# Patient Record
Sex: Male | Born: 1970 | Race: White | Hispanic: No | Smoking: Never smoker
Health system: Southern US, Community
[De-identification: ages and names within clinical notes are randomized; demographics above are authoritative.]

---

## 2019-11-04 ENCOUNTER — Emergency Department (HOSPITAL_BASED_OUTPATIENT_CLINIC_OR_DEPARTMENT_OTHER)
Admission: EM | Admit: 2019-11-04 | Discharge: 2019-11-04 | Disposition: A | Discharge status: Home / Self Care | Payer: No Typology Code available for payment source | Attending: Emergency Medicine | Admitting: Emergency Medicine

## 2019-11-04 ENCOUNTER — Encounter (HOSPITAL_BASED_OUTPATIENT_CLINIC_OR_DEPARTMENT_OTHER): Payer: Self-pay | Admitting: Emergency Medicine

## 2019-11-04 ENCOUNTER — Emergency Department (HOSPITAL_BASED_OUTPATIENT_CLINIC_OR_DEPARTMENT_OTHER): Payer: No Typology Code available for payment source

## 2019-11-04 ENCOUNTER — Other Ambulatory Visit: Payer: Self-pay

## 2019-11-04 DIAGNOSIS — W228XXA Striking against or struck by other objects, initial encounter: Secondary | ICD-10-CM | POA: Diagnosis not present

## 2019-11-04 DIAGNOSIS — Y9389 Activity, other specified: Secondary | ICD-10-CM | POA: Insufficient documentation

## 2019-11-04 DIAGNOSIS — S069X9A Unspecified intracranial injury with loss of consciousness of unspecified duration, initial encounter: Secondary | ICD-10-CM | POA: Insufficient documentation

## 2019-11-04 DIAGNOSIS — S0003XA Contusion of scalp, initial encounter: Secondary | ICD-10-CM | POA: Insufficient documentation

## 2019-11-04 DIAGNOSIS — Y990 Civilian activity done for income or pay: Secondary | ICD-10-CM | POA: Insufficient documentation

## 2019-11-04 DIAGNOSIS — Y92813 Airplane as the place of occurrence of the external cause: Secondary | ICD-10-CM | POA: Diagnosis not present

## 2019-11-04 DIAGNOSIS — S069X1A Unspecified intracranial injury with loss of consciousness of 30 minutes or less, initial encounter: Secondary | ICD-10-CM

## 2019-11-04 DIAGNOSIS — S0083XA Contusion of other part of head, initial encounter: Secondary | ICD-10-CM | POA: Diagnosis not present

## 2019-11-04 DIAGNOSIS — S0990XA Unspecified injury of head, initial encounter: Secondary | ICD-10-CM | POA: Diagnosis present

## 2019-11-04 DIAGNOSIS — J320 Chronic maxillary sinusitis: Secondary | ICD-10-CM | POA: Diagnosis not present

## 2019-11-04 NOTE — ED Triage Notes (Addendum)
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 Pt had a LOC for approximately 5 min per co-worker.  Pt was confused for several hours afterwards. EMS did come to scene and pt had stable vital signs. Pt c/o numbness to left cheek under eye. Denies any nausea.

## 2019-11-04 NOTE — ED Provider Notes (Signed)
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  He leaned down to avoid hitting his head on the door but the top of his head struck the edge of the door anyway.  He fell to the ground and states he was aware of people around him and asking him questions but states he was unable to respond or react for about 5 minutes.  It is unclear if he got knocked out, was just stunned, or had a syncopal episode.  Coworkers report that he was confused afterwards but that he returned to baseline.  He became concerned because he started having paresthesias and pain over his left cheekbone.  He rates pain in his head as a 6 out of 10 without significant modifying factors.  He has had some dizziness but no nausea or vomiting.   History reviewed. No pertinent past medical history.  History reviewed. No pertinent surgical history.  No family history on file.  Social History   Tobacco Use  . Smoking status: Never Smoker  . Smokeless tobacco: Never Used  Substance Use Topics  . Alcohol use: Never  . Drug use: Never    Prior to Admission medications   Not on File    Allergies Penicillins and Tetanus toxoids   REVIEW OF SYSTEMS  Negative except as noted here or in the History of Present Illness.   PHYSICAL EXAMINATION  Initial Vital Signs Blood pressure (!) 161/94, pulse 67, temperature 98.2 F (36.8 C), temperature source Oral, resp. rate 18, height 6\' 1"  (1.854 m), weight 75.3 kg, SpO2 99 %.  Examination General: Well-developed, well-nourished male in no acute distress; appearance consistent with age of record HENT: normocephalic; superficial hematoma to top of head and over left  cheek; no hemotympanum Eyes: pupils equal, round and reactive to light; extraocular muscles intact Neck: supple; nontender Heart: regular rate and rhythm Lungs: clear to auscultation bilaterally Abdomen: soft; nondistended; nontender; bowel sounds present Extremities: No deformity; full range of motion; pulses normal Neurologic: Awake, alert and oriented; motor function intact in all extremities and symmetric; no facial droop; normal coordination and speech Skin: Warm and dry Psychiatric: Normal mood and affect   RESULTS  Summary of this visit's results, reviewed and interpreted by myself:   EKG Interpretation  Date/Time:    Ventricular Rate:    PR Interval:    QRS Duration:   QT Interval:    QTC Calculation:   R Axis:     Text Interpretation:        Laboratory Studies: No results found for this or any previous visit (from the past 24 hour(s)). Imaging Studies: CT Head Wo Contrast  Result Date: 11/04/2019 CLINICAL DATA:  Head trauma. History of loss of consciousness. Headache and confusion. EXAM: CT HEAD WITHOUT CONTRAST TECHNIQUE: Contiguous axial images were obtained from the base of the skull through the vertex without intravenous contrast. COMPARISON:  None. FINDINGS: Brain: The ventricles are normal in size and configuration. No extra-axial fluid collections are identified. The gray-white differentiation is maintained. No CT findings for acute hemispheric infarction or intracranial hemorrhage. No mass lesions. The brainstem and cerebellum are normal. Vascular: No hyperdense vessels or obvious aneurysm.  Skull: No acute skull fracture. No bone lesion. Sinuses/Orbits: Near complete opacification of both maxillary sinuses likely due to large mucous retention cysts or polyps. Area of higher attenuation in the right maxillary sinus is likely inspissated material and could suggest chronic changes. Small amount of fluid in the sphenoid sinus. The frontal sinuses are clear. The mastoid  air cells and middle ear cavities are clear. Other: No scalp lesions or hematoma. IMPRESSION: 1. No acute intracranial findings or skull fracture. 2. Paranasal sinus disease. Electronically Signed   By: Rudie Meyer M.D.   On: 11/04/2019 06:40    ED COURSE and MDM  Nursing notes, initial and subsequent vitals signs, including pulse oximetry, reviewed and interpreted by myself.  Vitals:   11/04/19 0614 11/04/19 0615  BP: (!) 161/94   Pulse: 67   Resp: 18   Temp: 98.2 F (36.8 C)   TempSrc: Oral   SpO2: 99%   Weight:  75.3 kg  Height:  6\' 1"  (1.854 m)   Medications - No data to display  No evidence of significant intracranial injury on CT scan.  Patient advised of possible postconcussive symptoms and concerning symptoms that should warrant reevaluation.  PROCEDURES  Procedures   ED DIAGNOSES     ICD-10-CM   1. Head injury, closed, with brief LOC (HCC)  S06.9X9A   2. Chronic sinusitis of both maxillary sinuses  J32.0   3. Contusion of face, initial encounter  S00.83XA   4. Contusion of scalp, initial encounter  S00. , MD 11/04/19 [phone]

## 2019-11-11 ENCOUNTER — Emergency Department (HOSPITAL_BASED_OUTPATIENT_CLINIC_OR_DEPARTMENT_OTHER)
Admission: EM | Admit: 2019-11-11 | Discharge: 2019-11-11 | Disposition: A | Discharge status: Home / Self Care | Payer: No Typology Code available for payment source | Attending: Emergency Medicine | Admitting: Emergency Medicine

## 2019-11-11 ENCOUNTER — Encounter (HOSPITAL_BASED_OUTPATIENT_CLINIC_OR_DEPARTMENT_OTHER): Payer: Self-pay | Admitting: Emergency Medicine

## 2019-11-11 ENCOUNTER — Emergency Department (HOSPITAL_BASED_OUTPATIENT_CLINIC_OR_DEPARTMENT_OTHER): Payer: No Typology Code available for payment source

## 2019-11-11 DIAGNOSIS — S060X9A Concussion with loss of consciousness of unspecified duration, initial encounter: Secondary | ICD-10-CM | POA: Insufficient documentation

## 2019-11-11 DIAGNOSIS — M545 Low back pain, unspecified: Secondary | ICD-10-CM

## 2019-11-11 DIAGNOSIS — X58XXXA Exposure to other specified factors, initial encounter: Secondary | ICD-10-CM | POA: Diagnosis not present

## 2019-11-11 DIAGNOSIS — Y990 Civilian activity done for income or pay: Secondary | ICD-10-CM | POA: Diagnosis not present

## 2019-11-11 DIAGNOSIS — Y929 Unspecified place or not applicable: Secondary | ICD-10-CM | POA: Insufficient documentation

## 2019-11-11 DIAGNOSIS — S0990XA Unspecified injury of head, initial encounter: Secondary | ICD-10-CM | POA: Diagnosis present

## 2019-11-11 DIAGNOSIS — Y939 Activity, unspecified: Secondary | ICD-10-CM | POA: Insufficient documentation

## 2019-11-11 DIAGNOSIS — S060X9D Concussion with loss of consciousness of unspecified duration, subsequent encounter: Secondary | ICD-10-CM

## 2019-11-11 MED ORDER — METHOCARBAMOL 500 MG PO TABS
500.0000 mg | ORAL_TABLET | Freq: Two times a day (BID) | ORAL | 0 refills | Status: AC
Start: 2019-11-11 — End: ?

## 2019-11-11 NOTE — ED Provider Notes (Signed)
[facility] EMERGENCY DEPARTMENT Provider Note   CSN: [id number] Arrival date & time: 11/11/19  2051     History Chief Complaint  Patient presents with  . Back Pain  . Head Injury    Spencer Salazar is a 49 y.o. male who presents to the ED with complaint of gradual onset, constant, achy, low back pain s/p injury that occurred at work last week.  Per chart review patient was seen in the ED on 07/01 after he sustained a head injury while working at work.  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  He fell to the ground and had loss of consciousness for several minutes.  Head CT at that time was negative and patient was discharged home.  He states that since then he has continued to feel extremely fatigued.  He states that he has felt nauseated and had a lack of appetite.  He states that he has been staying at home and not going to work but despite this feels like he worked a 12-hour day.  He states he is having some twitching in his bilateral eyes as well.  Patient states that the back pain has been getting worse however he has not been taking any medication for it.  He denies any weakness, numbness, tingling, urinary retention, saddle anesthesia, urinary or bowel incontinence, any other associated symptoms.   The history is provided by the patient and medical records.       History reviewed. No pertinent past medical history.  There are no problems to display for this patient.   History reviewed. No pertinent surgical history.     No family history on file.  Social History   Tobacco Use  . Smoking status: Never Smoker  . Smokeless tobacco: Never Used  Substance Use Topics  . Alcohol use: Never  . Drug use: Never    Home Medications Prior to Admission medications   Medication Sig Start Date End Date Taking? Authorizing Provider  methocarbamol (ROBAXIN) 500 MG tablet Take 1 tablet (500 mg  total) by mouth 2 (two) times daily. 11/11/19   Tanda Rockers, PA-C    Allergies    Penicillins and Tetanus toxoids  Review of Systems   Review of Systems  Constitutional: Positive for appetite change and fatigue.  Gastrointestinal: Positive for nausea.  Musculoskeletal: Positive for back pain.  Neurological: Positive for headaches.  All other systems reviewed and are negative.   Physical Exam Updated Vital Signs BP (!) 138/101   Pulse 76   Temp 98.5 F (36.9 C) (Oral)   Resp 18   Ht 6\' 1"  (1.854 m)   Wt 75.3 kg   SpO2 99%   BMI 21.90 kg/m   Physical Exam Vitals and nursing note reviewed.  Constitutional:      Appearance: He is not ill-appearing or diaphoretic.  HENT:     Head: Normocephalic and atraumatic.  Eyes:     Extraocular Movements: Extraocular movements intact.     Conjunctiva/sclera: Conjunctivae normal.     Pupils: Pupils are equal, round, and reactive to light.  Cardiovascular:     Rate and Rhythm: Normal rate and regular rhythm.  Pulmonary:     Effort: Pulmonary effort is normal.     Breath sounds: Normal breath sounds.  Abdominal:     Palpations: Abdomen is soft.     Tenderness: There is no abdominal tenderness.  Musculoskeletal:  Cervical back: Neck supple.     Comments: No C or T midline spinal TTP. Mild lumbar midline spinal TTP however + paralumbar musculature TTP as well surrounding the area. ROM intact to neck and back. MAE without difficulty. Strength and sensation intact. 2+ distal pulses.   Skin:    General: Skin is warm and dry.  Neurological:     Mental Status: He is alert.     Comments: CN 3-12 grossly intact A&O x4 GCS 15 Sensation and strength intact Gait nonataxic including with tandem walking Coordination with finger-to-nose WNL Neg romberg, neg pronator drift     ED Results / Procedures / Treatments   Labs (all labs ordered are listed, but only abnormal results are displayed) Labs Reviewed - No data to  display  EKG None  Radiology DG Lumbar Spine Complete  Result Date: 11/11/2019 CLINICAL DATA:  Low back pain, recent head injury, initial encounter EXAM: LUMBAR SPINE - COMPLETE 4+ VIEW COMPARISON:  None. FINDINGS: Five lumbar type vertebral bodies are well visualized. Vertebral body height is well maintained. No pars defects are noted. No anterolisthesis is seen. Disc space narrowing is noted at L4-5 and to a greater degree at L5-S1. no soft tissue abnormality is seen. IMPRESSION: Mild degenerative change without acute abnormality. Electronically Signed   By: Alcide Clever M.D.   On: 11/11/2019 22:48    Procedures Procedures (including critical care time)  Medications Ordered in ED Medications - No data to display  ED Course  I have reviewed the triage vital signs and the nursing notes.  Pertinent labs & imaging results that were available during my care of the patient were reviewed by me and considered in my medical decision making (see chart for details).    MDM Rules/Calculators/A&P                           49 year old male who presents to the ED today with subsequent complaints of fatigue, nausea, lack of appetite, difficulty concentrating after head injury 1 week ago.  Was evaluated in the ED at that time with a negative CT head.  He states since then he has been having back pain in the lower aspect midline.  On exam patient does have some very minimal midline spinal tenderness however surrounding musculature tenderness as well.  I very much doubt fracture at this time however will obtain x-ray for assurance to patient.  He has no red flag symptoms today concerning for cauda equina, spinal epidural abscess, AAA.  He has no focal neuro deficits on exam today.  He continues to complain of a headache.  I suspect his symptoms are all related to a concussion.  He does appear extremely anxious regarding the fact that his work wants him to return and he does not feel up to it.  Will provide  information for concussion clinic at this time and write patient out for the next couple of days so he can continue to rest.  Do Not feel he needs repeat imaging at this time.  Xray negative at this time.  Discharged home with muscle relaxer.  Patient instructed to take ibuprofen and Tylenol as needed for pain.  Have given information for the concussion clinic.  Have written patient out of work for the next couple of days however he will need to be evaluated by concussion clinic if he needs to be out for longer.  Discharge at this time.  This note was prepared using  [employer] and may include unintentional dictation errors due to the inherent limitations of voice recognition software.  Final Clinical Impression(s) / ED Diagnoses Final diagnoses:  Concussion with loss of consciousness, subsequent encounter  Acute midline low back pain without sciatica    Rx / DC Orders ED Discharge Orders         Ordered    methocarbamol (ROBAXIN) 500 MG tablet  2 times daily     Discontinue  Reprint     11/11/19 2254           Discharge Instructions     Your xray did not show any signs of fracture in your back. Your pain is likely musculoskeletal in nature. Please take Ibuprofen and Tylenol as needed for pain. I have provided a muscle relaxer for you as well. Please take to any pharmacy and fill. DO NOT DRIVE WHILE ON THE MUSCLE RELAXER AS IT CAN MAKE YOU DROWSY.   I have provided a work note for the next couple of days however a specialist will need to decide if you have to be out for longer. Please contact the Concussion Clinic for further evaluation.   The  [facility] is the only comprehensive, holistic concussion clinic in the [place], [place] area. You can speak with one of our concussion-trained staff members during our regular office hours, Monday - Thursday from 7:30 AM to 4:30 PM, and Fridays from 7:30 AM to 12:00 PM, by calling our Concussion  Hotline: [phone].       Tanda Rockers, PA-C 11/11/19 2255    Virgina Norfolk, DO 11/11/19 2309

## 2019-11-11 NOTE — ED Triage Notes (Addendum)
Pt was seen 7/1 for head injury and now having lower back pain. Pt c/o fatigue, increased sleeping and decreased appetite. Pt states he has tried to return to work but having difficulty. Pt reports feeling anxious which is new for him

## 2019-11-11 NOTE — Discharge Instructions (Signed)
Your xray did not show any signs of fracture in your back. Your pain is likely musculoskeletal in nature. Please take Ibuprofen and Tylenol as needed for pain. I have provided a muscle relaxer for you as well. Please take to any pharmacy and fill. DO NOT DRIVE WHILE ON THE MUSCLE RELAXER AS IT CAN MAKE YOU DROWSY.   I have provided a work note for the next couple of days however a specialist will need to decide if you have to be out for longer. Please contact the Concussion Clinic for further evaluation.   The [facility] is the only comprehensive, holistic concussion clinic in the [place], [place] area. You can speak with one of our concussion-trained staff members during our regular office hours, Monday - Thursday from 7:30 AM to 4:30 PM, and Fridays from 7:30 AM to 12:00 PM, by calling our Concussion Hotline: [phone].

## 2021-06-03 IMAGING — CR DG LUMBAR SPINE COMPLETE 4+V
5 series · 5 of 5 positions shown · non-contrast
Comparison: None.

CLINICAL DATA: Low back pain, recent head injury, initial encounter

EXAM:
LUMBAR SPINE - COMPLETE 4+ VIEW

[t l-spine a.p.]
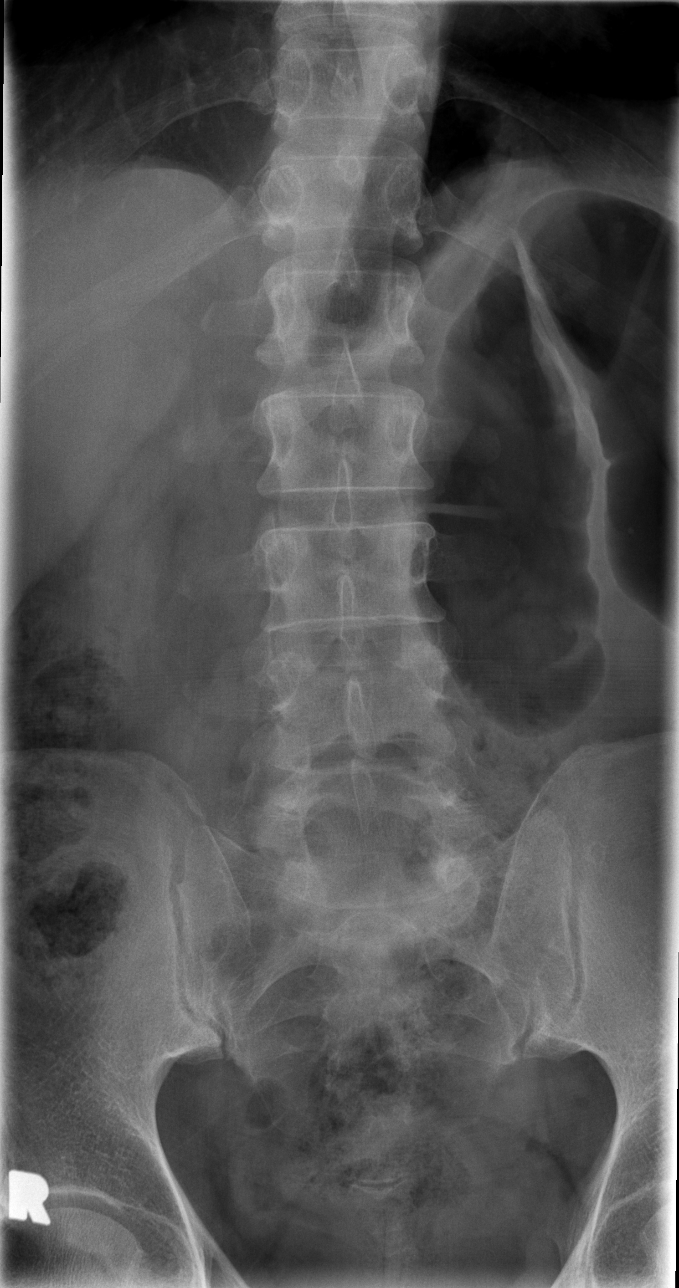

[t l-spine oblique exposure (1 of 2)]
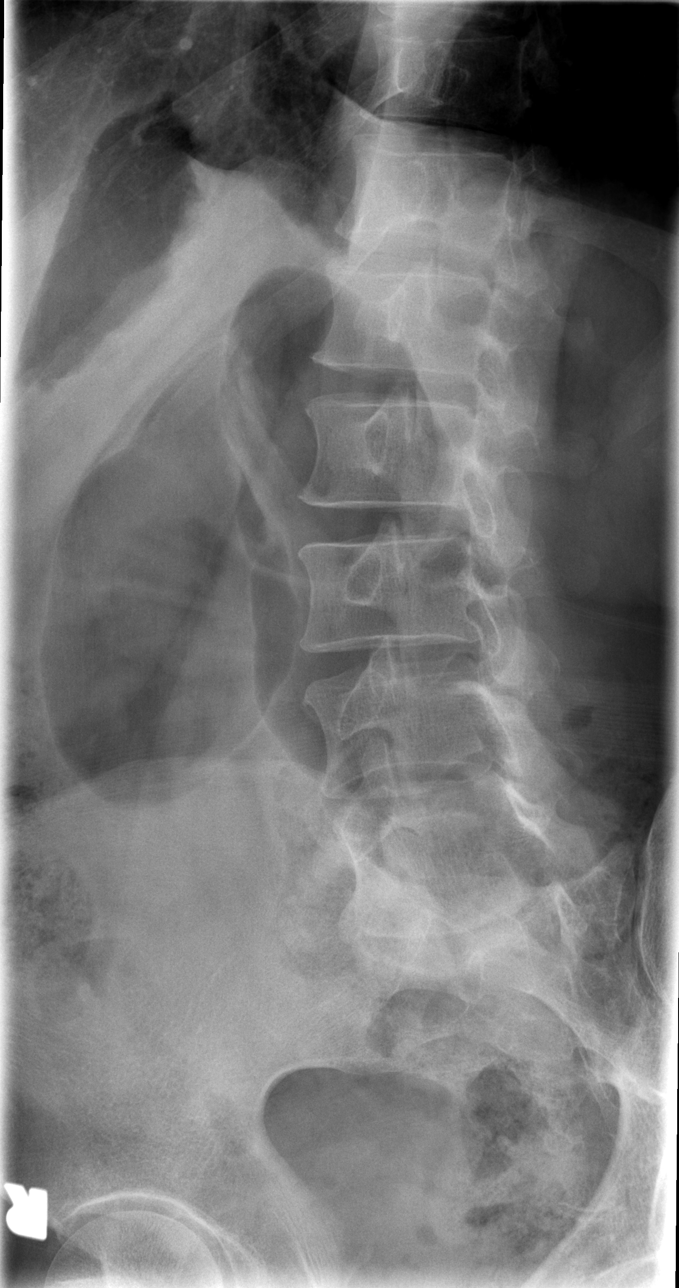

[t l-spine oblique exposure (2 of 2)]
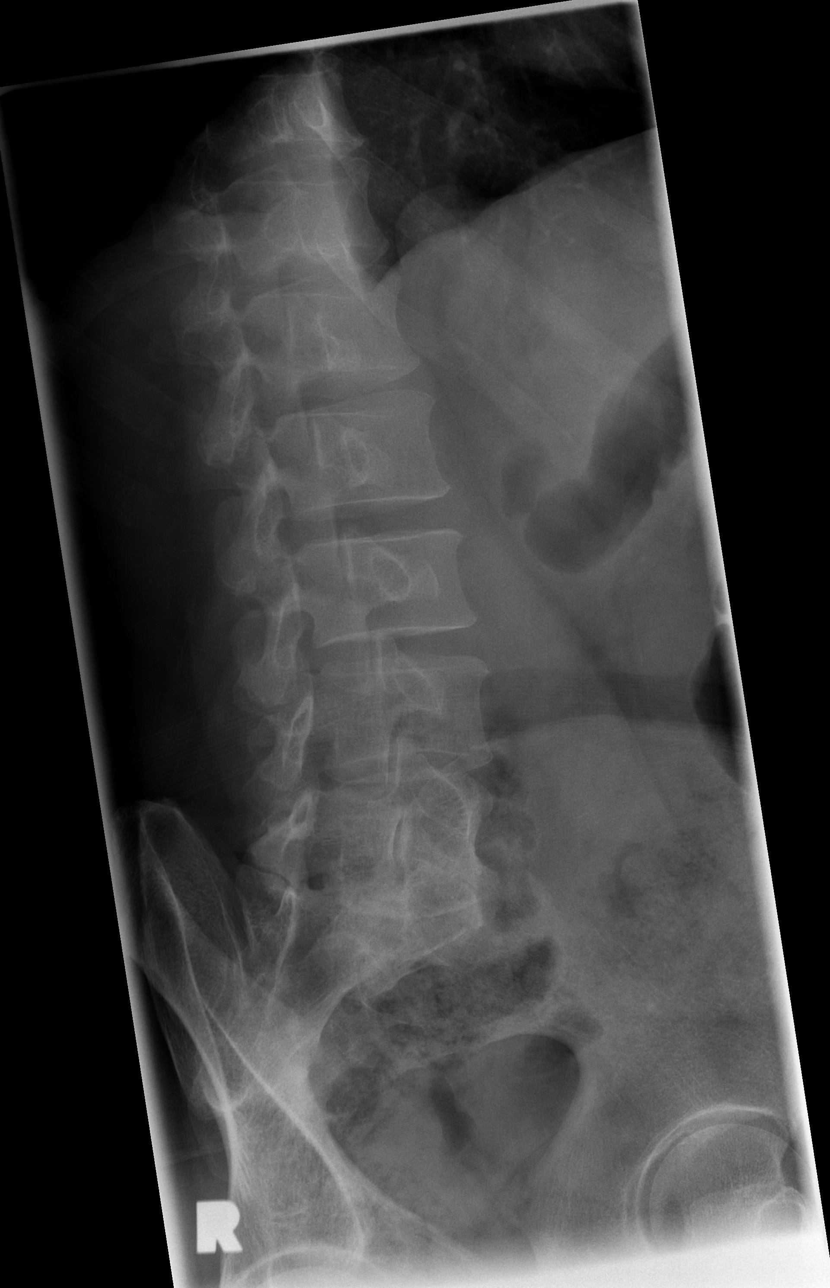

[t l-spine lat]
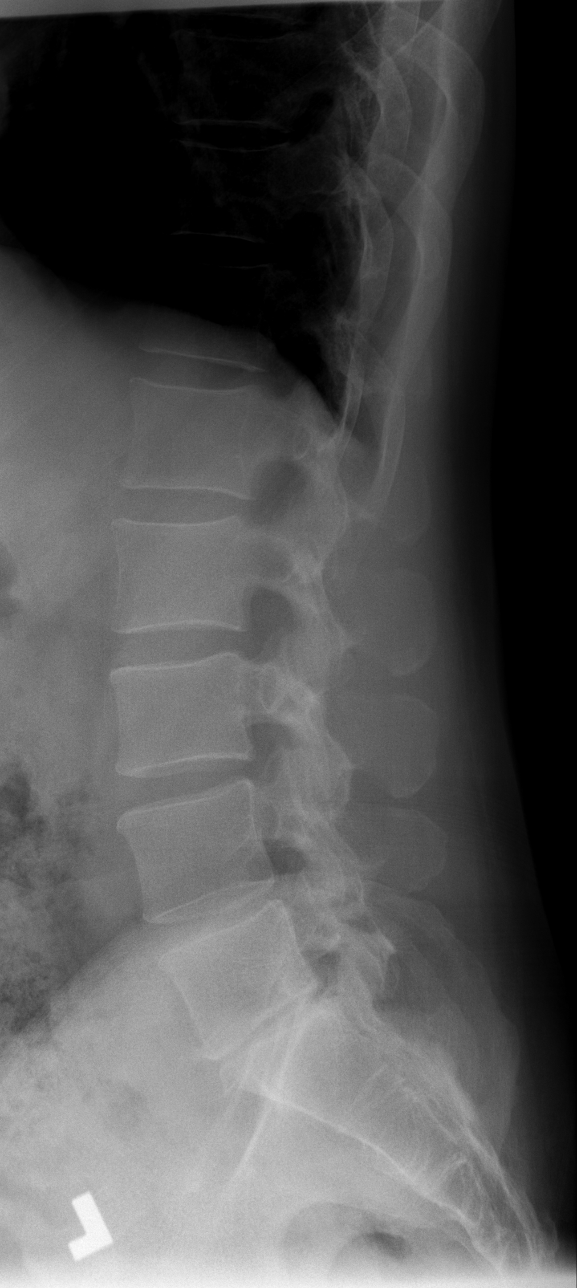

[t l-spine l5-s1 spot]
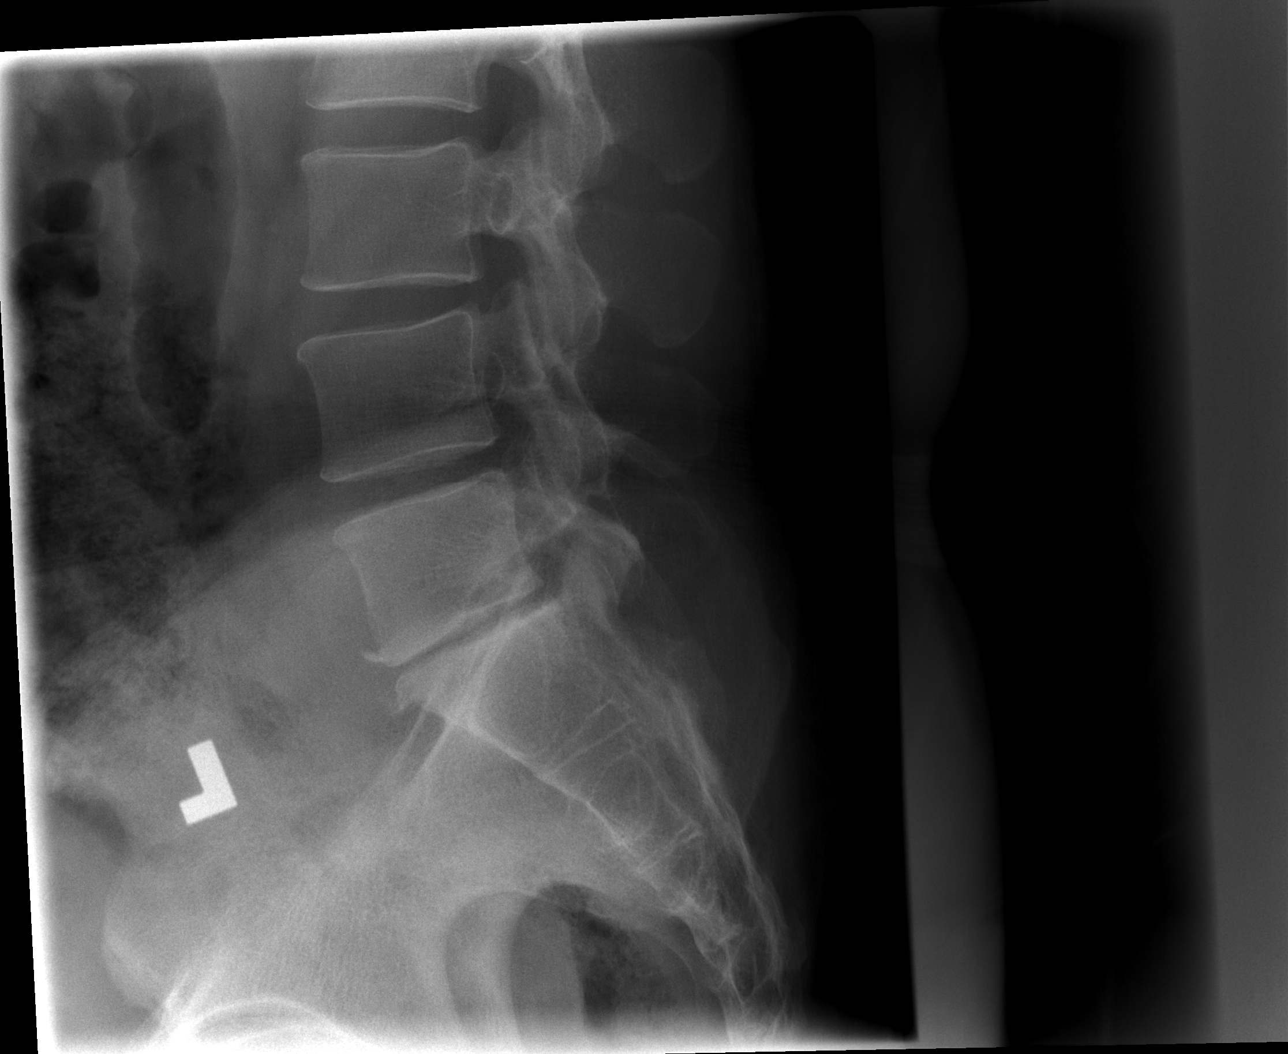

[5 of 5 positions shown; findings below may reference images not displayed]

FINDINGS: Five lumbar type vertebral bodies are well visualized. Vertebral
body height is well maintained. No pars defects are noted. No
anterolisthesis is seen. Disc space narrowing is noted at L4-5 and
to a greater degree at L5-S1. no soft tissue abnormality is seen.
IMPRESSION: Mild degenerative change without acute abnormality.
